# Patient Record
Sex: Female | Born: 1975 | Hispanic: Yes | Marital: Married | Smoking: Never smoker
Health system: Southern US, Community
[De-identification: ages and names within clinical notes are randomized; demographics above are authoritative.]

## PROBLEM LIST (undated history)

## (undated) DIAGNOSIS — D649 Anemia, unspecified: Secondary | ICD-10-CM

## (undated) HISTORY — PX: EYE SURGERY: SHX253

---

## 2015-04-29 ENCOUNTER — Ambulatory Visit (HOSPITAL_COMMUNITY)
Admission: RE | Admit: 2015-04-29 | Discharge: 2015-04-29 | Disposition: A | Discharge status: Home / Self Care | Payer: BLUE CROSS/BLUE SHIELD | Source: Ambulatory Visit | Attending: Obstetrics and Gynecology | Admitting: Obstetrics and Gynecology

## 2015-04-29 ENCOUNTER — Other Ambulatory Visit (HOSPITAL_COMMUNITY): Payer: Self-pay | Admitting: Obstetrics and Gynecology

## 2015-04-29 ENCOUNTER — Encounter (HOSPITAL_COMMUNITY): Payer: Self-pay

## 2015-04-29 VITALS — BP 102/54 | HR 76 | Wt 158.8 lb

## 2015-04-29 DIAGNOSIS — Z3A13 13 weeks gestation of pregnancy: Secondary | ICD-10-CM | POA: Insufficient documentation

## 2015-04-29 DIAGNOSIS — Z315 Encounter for genetic counseling: Secondary | ICD-10-CM | POA: Insufficient documentation

## 2015-04-29 DIAGNOSIS — O9989 Other specified diseases and conditions complicating pregnancy, childbirth and the puerperium: Secondary | ICD-10-CM

## 2015-04-29 DIAGNOSIS — T8331XA Breakdown (mechanical) of intrauterine contraceptive device, initial encounter: Secondary | ICD-10-CM

## 2015-04-29 DIAGNOSIS — O09521 Supervision of elderly multigravida, first trimester: Secondary | ICD-10-CM

## 2015-04-29 DIAGNOSIS — Z975 Presence of (intrauterine) contraceptive device: Secondary | ICD-10-CM | POA: Insufficient documentation

## 2015-04-29 DIAGNOSIS — O2631 Retained intrauterine contraceptive device in pregnancy, first trimester: Secondary | ICD-10-CM

## 2015-04-29 DIAGNOSIS — Z331 Pregnant state, incidental: Secondary | ICD-10-CM

## 2015-04-29 DIAGNOSIS — O09529 Supervision of elderly multigravida, unspecified trimester: Secondary | ICD-10-CM

## 2015-05-01 ENCOUNTER — Other Ambulatory Visit (HOSPITAL_COMMUNITY): Payer: Self-pay | Admitting: Obstetrics and Gynecology

## 2015-05-01 DIAGNOSIS — O09529 Supervision of elderly multigravida, unspecified trimester: Secondary | ICD-10-CM | POA: Insufficient documentation

## 2015-05-01 NOTE — Progress Notes (Signed)
Genetic Counseling  High-Risk Gestation Note  Appointment Date:  04/29/2015 Referred By: Stacie Newport, MD Date of Birth:  [birth year] Partner:  Stacie Richardson   Pregnancy History: [id number] Estimated Date of Delivery: 10/30/15 Estimated Gestational Age: [redacted]w[redacted]d Attending: Eulis Foster, MD   [name], Stacie Richardson, were seen for genetic counseling because of a maternal age of 39 y.o..   Spanish/English medical interpreter, Stacie Richardson, provided interpretation for today's visit.   In Summary:  Discussed advanced maternal age and associations with fetal aneuploidy  Couple elected for ultrasound only in pregnancy and declined additional screening for fetal aneuploidy (including first screen, Quad screen, and NIPS)  Declined amniocentesis  MFM consult today for retained IUD in pregnancy; See separate MFM consult note.   They were counseled regarding maternal age and the association with risk for chromosome conditions due to nondisjunction with aging of the ova.   We reviewed chromosomes, nondisjunction, and the associated 1 in 44 risk for fetal aneuploidy at [redacted]w[redacted]d gestation related to a maternal age of 39 years old at delivery. They were counseled that the risk for aneuploidy decreases as gestational age increases, accounting for those pregnancies which spontaneously abort.  We specifically discussed Down syndrome (trisomy 86), trisomies 22 and 22, and sex chromosome aneuploidies (47,XXX and 47,XXY) including the common features and prognoses of each.   We reviewed available screening options including First Screen, Quad screen, noninvasive prenatal screening (NIPS)/cell free DNA (cfDNA) testing, and detailed ultrasound.  They were counseled that screening tests are used to modify a patient's a priori risk for aneuploidy, typically based on age. This estimate provides a pregnancy specific risk assessment. We reviewed the benefits and limitations of  each option. Specifically, we discussed the conditions for which each test screens, the detection rates, and false positive rates of each. They were also counseled regarding diagnostic testing via amniocentesis. We reviewed the approximate 1 in 300-500 risk for complications for amniocentesis, including spontaneous pregnancy loss. After consideration of all the options, they elected to proceed with ultrasound only in pregnancy and declined maternal serum screening (first screen and Quad screen), NIPS, and amniocentesis for testing for fetal aneuploidy. Detailed ultrasound is scheduled for 05/29/15.  They understand that screening tests cannot rule out all birth defects or genetic syndromes. The patient was advised of this limitation and states she still does not want additional testing at this time.   Ultrasound was performed today. Complete ultrasound results reported separately.   Stacie Richardson was provided with written information regarding sickle cell anemia (SCA) including the carrier frequency and incidence in the Hispanic population, the availability of carrier testing and prenatal diagnosis if indicated.  In addition, we discussed that hemoglobinopathies are routinely screened for as part of the [place] newborn screening panel.  Hemoglobin electrophoresis is available to the patient if not previously performed and if desired.   Both family histories were reviewed and found to be contributory for congenital differences in fingers and toes for the couple's oldest child, a daughter. 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 She is currently 39 years old and is healthy. No additional relatives were reported with differences in their digits. We discussed that congenital differences involving digits can be isolated or syndromic. They can be caused by multifactorial, environmental, or  genetic factors. Isolated differences in fingers and toes can be observed  in some families as an autosomal dominant trait. Given the reported family history, recurrence risk for the current pregnancy would be increased. We discussed that targeted ultrasound is available to assess development of digits. However, they understand that ultrasound cannot detect all physical differences prenatally.  Without further information regarding the provided family history, an accurate genetic risk cannot be calculated. Further genetic counseling is warranted if more information is obtained.  Stacie Richardson denied exposure to environmental toxins or chemical agents. She denied the use of alcohol, tobacco or street drugs. She denied significant viral illnesses during the course of her pregnancy. Her medical and surgical histories were contributory for retained intrauterine device in the current pregnancy. Ms. Poppell had maternal fetal medicine consultation today to discuss this history. See separate MFM consult note.    I counseled this couple regarding the above risks and available options.  The approximate face-to-face time with the genetic counselor was 45 minutes.  Stacie Plowman, MS,  Certified Genetic Counselor 05/01/2015

## 2015-05-01 NOTE — Consult Note (Signed)
MFM consult  39 yr old [id number] at 59w5dreferred for consult secondary to pregnancy with retained copper IUD.  Past Ob hx: 3 full term SVD  No other significant history.  Patient reports has had bleeding this pregnancy. Conceived with copper IUD in place. IUD had been present for 7 years. Per primary OB note the strings could not be visualized at the os.  Ultrasound today shows single intrauterine pregnancy. Fetal crown rump length is consistent with dating. There is a large subchorionic hemorrhage seen. The IUD is seen anterior lower uterine segment.  I counseled the patient as follows: 1. Pregnancy with retained IUD: - discussed risks of miscarriage, bleeding, infection, pregnancy loss, and preterm delivery - discussed these risks are as high as 50% - discussed recommendations are for removal of the IUD if feasible as it significantly lowers the above risks; although these risks still remain elevated over baseline - however if IUD cannot easily be removed as in this case the risks of attempting a technically difficult and involved removal must be weighed against risks of a retained IUD - discussed given the location of the IUD there would be significant risks in attempting removal - I offered to refer patient to Gynecology for them to evaluate if this IUD could be removed - the patient and her husband declined and after considering the above wish to just keep the IUD in place - I recommend bleeding and preterm labor precautions - recommend serial fetal growth  2. Advanced maternal age: - patient met with the genetic counselor; see separate report - after counseling patient declined aneuploidy testing/screening  3. Recommend fetal anatomic survey at 18-20 weeks  I spent a total of 45 minutes with the patient of which >50% was in face to face consultation.  Please call with questions.  [place] MD

## 2015-05-29 ENCOUNTER — Encounter (HOSPITAL_COMMUNITY): Payer: Self-pay

## 2015-05-29 ENCOUNTER — Ambulatory Visit (HOSPITAL_COMMUNITY)
Admission: RE | Admit: 2015-05-29 | Discharge: 2015-05-29 | Disposition: A | Discharge status: Home / Self Care | Payer: BLUE CROSS/BLUE SHIELD | Source: Ambulatory Visit | Attending: Obstetrics and Gynecology | Admitting: Obstetrics and Gynecology

## 2015-05-29 ENCOUNTER — Other Ambulatory Visit (HOSPITAL_COMMUNITY): Payer: Self-pay | Admitting: Obstetrics and Gynecology

## 2015-05-29 DIAGNOSIS — O09521 Supervision of elderly multigravida, first trimester: Secondary | ICD-10-CM | POA: Diagnosis not present

## 2015-05-29 DIAGNOSIS — T8331XA Breakdown (mechanical) of intrauterine contraceptive device, initial encounter: Secondary | ICD-10-CM

## 2015-05-29 DIAGNOSIS — Z331 Pregnant state, incidental: Secondary | ICD-10-CM

## 2015-05-29 DIAGNOSIS — O9989 Other specified diseases and conditions complicating pregnancy, childbirth and the puerperium: Secondary | ICD-10-CM

## 2015-05-29 HISTORY — DX: Anemia, unspecified: D64.9

## 2015-06-26 ENCOUNTER — Ambulatory Visit (HOSPITAL_COMMUNITY): Admission: RE | Admit: 2015-06-26 | Payer: BLUE CROSS/BLUE SHIELD | Source: Ambulatory Visit

## 2016-03-03 ENCOUNTER — Encounter (HOSPITAL_COMMUNITY): Payer: Self-pay | Admitting: *Deleted

## 2016-11-11 IMAGING — US US MFM OB COMPLETE +14 WKS
1 series · 13 of 28 positions shown · non-contrast
Comparison: none

OBSTETRICS REPORT
(Signed Final 05/10/2015 [DATE])

HARENDRA KUMAR
Service(s) Provided
US MFM OB COMP LESS THAN 14 WEEKS                     76801.4
Indications
13 weeks gestation of pregnancy
IUD with pregnancy
Advanced maternal age multigravida (38), first
trimester
Fetal Evaluation
Num Of Fetuses:    1
Preg. Location:    Intrauterine
Fetal Heart Rate:  148                          bpm
Cardiac Activity:  Observed
Presentation:      Variable
Placenta:          Anterior
Comment:    Moderate subchorionic hemorrhage in fundus measuring
3.0 x 1.7 x 5.0 cm
Amniotic Fluid
AFI FV:      Subjectively within normal limits
Biometry
CRL:     85.4  mm     G. Age:  14w 0d                  EDD:   10/28/15
Gestational Age
LMP:           13w 5d        Date:  01/23/15                 EDD:   10/30/15
Best:          13w 5d     Det. By:  LMP  (01/23/15)          EDD:   10/30/15
Anatomy
Cranium:          Appears normal         Cord Vessels:     Appears normal (3
vessel cord)
Choroid Plexus:   Appears normal         Bladder:          Appears normal
Stomach:          Appears normal, left   Lower             Visualized
sided                  Extremities:
Abdominal Wall:   Appears nml (cord      Upper             Visualized
insert, abd wall)      Extremities:
Cervix Uterus Adnexa
Cervix:       Normal appearance by transabdominal scan.
Uterus:       IUD in lower uterine segment.
Left Ovary:    Within normal limits.
Right Ovary:   Complex mass measuring 2.5 x 2.6 x 2.6 cm.
Impression
INDICATION: 38 yr old RGMGCCG at 55w6d for fetal ultrasound.
Conceived with IUD in place.

[Series 1: us mfm ob complete +14 wks · 0.21mm/px · 58 acquisitions, 13 frames shown]
[im 3/58]
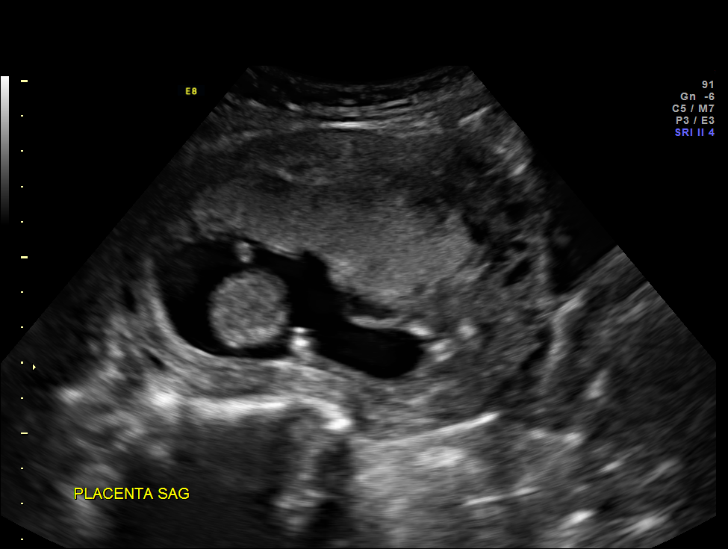
[im 7/58]
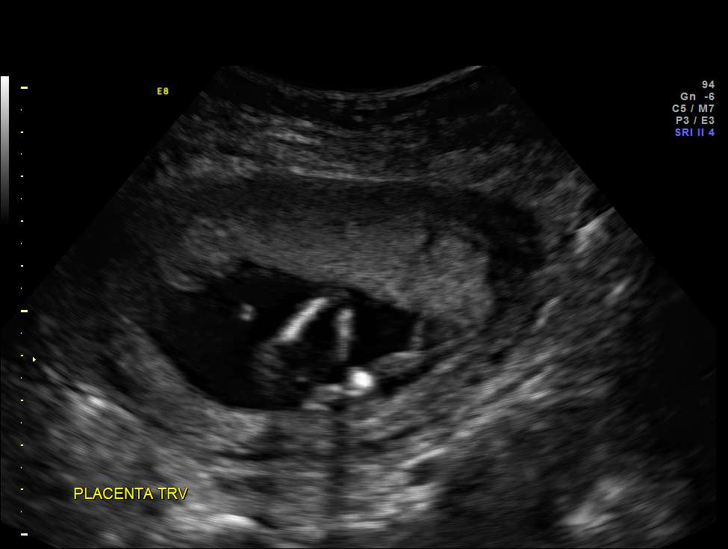
[im 11/58]
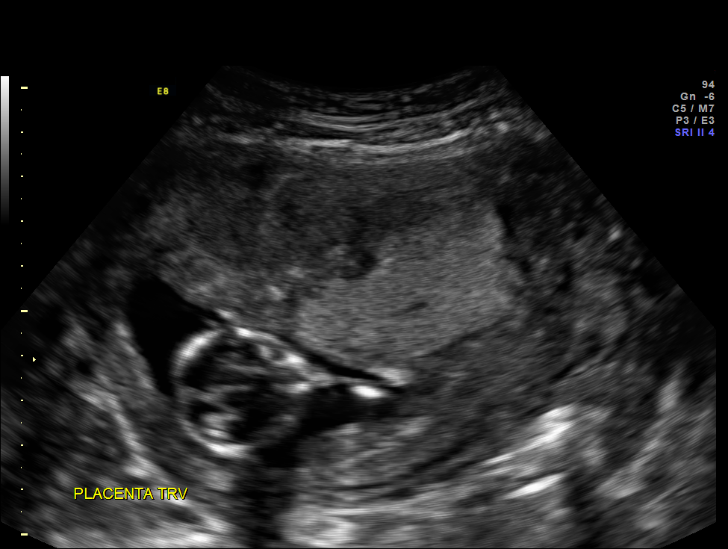
[im 15/58]
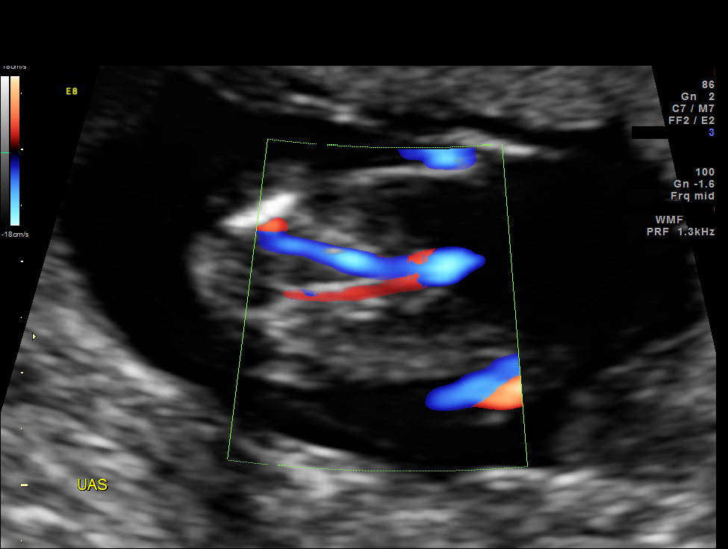
[im 20/58]
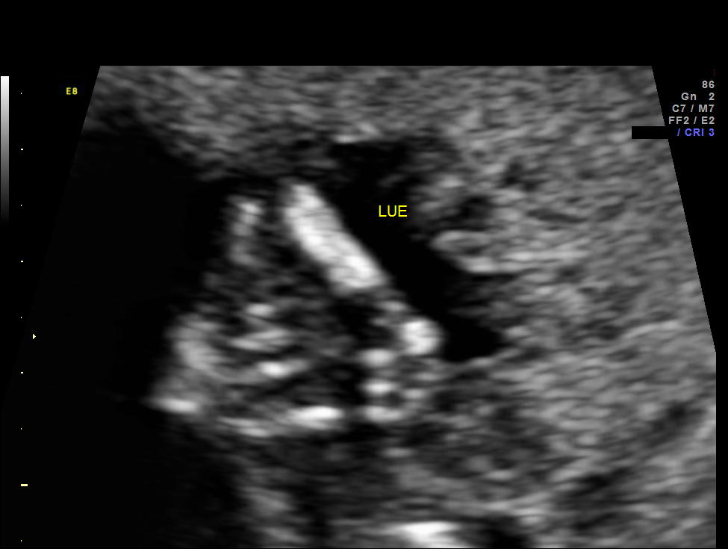
[im 24/58]
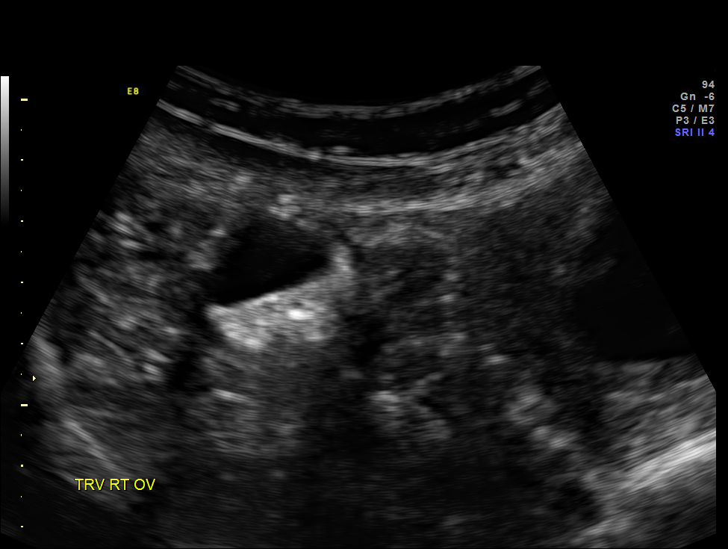
[im 30/58]
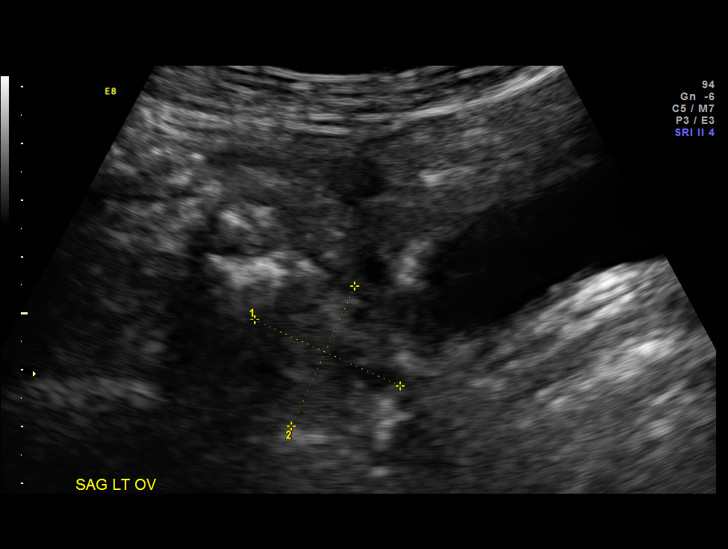
[im 34/58]
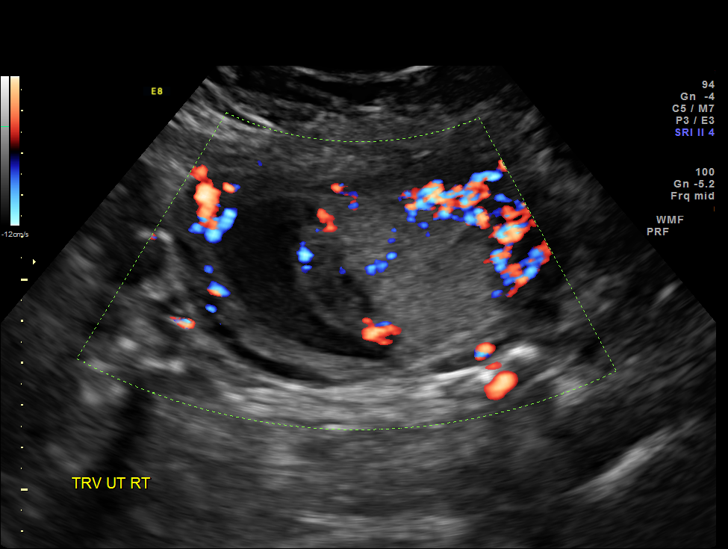
[im 39/58]
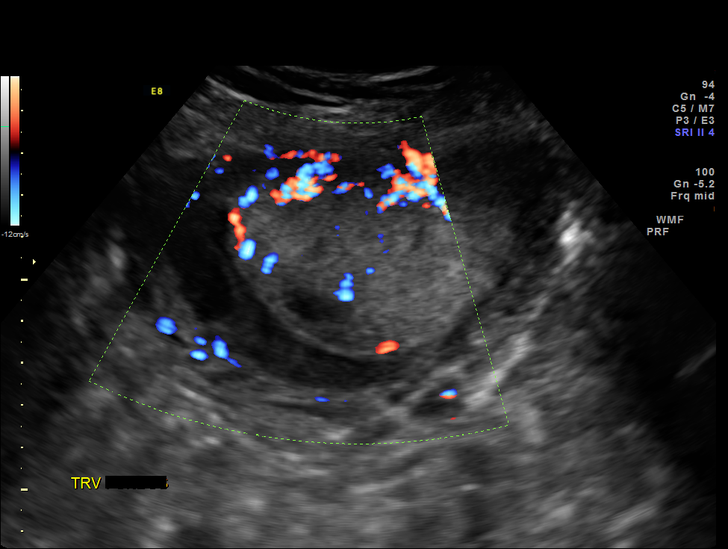
[im 43/58]
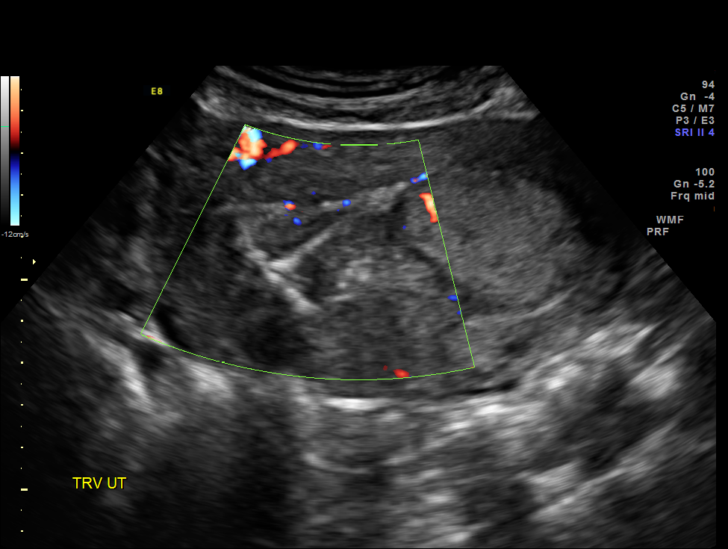
[im 47/58]
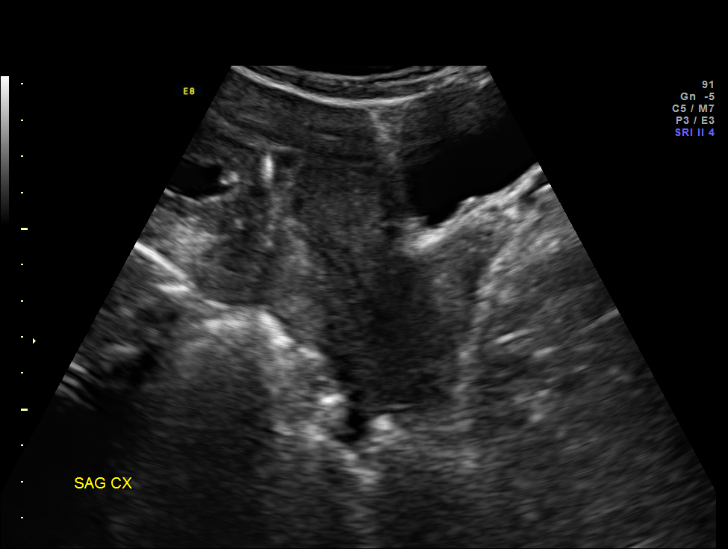
[im 51/58]
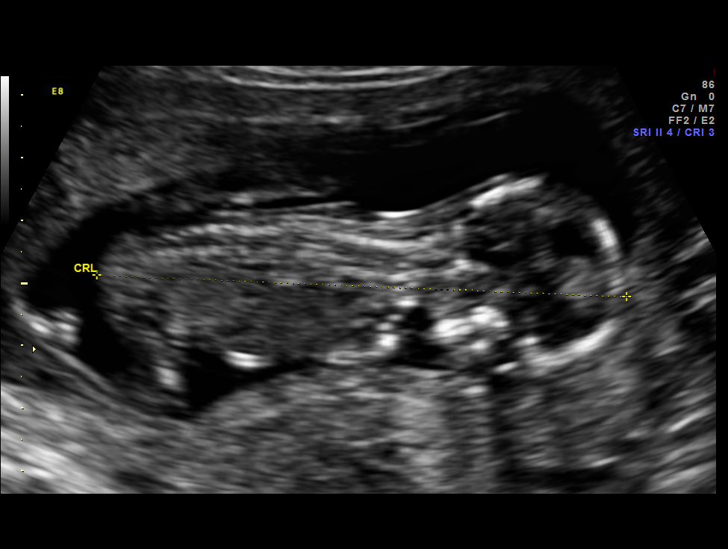
[im 55/58]
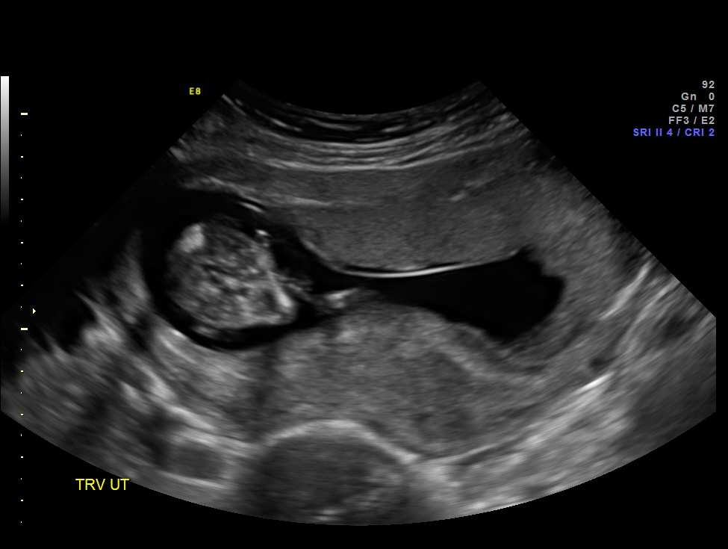

[13 of 28 positions shown; findings below may reference images not displayed]

FINDINGS: 1. Single intrauterine pregnancy.
2. Fetal crown rump length is consistent with dating.
3. [place] uterus; there is a right ovarian cyst measuring
2.6cm.
4. The IUD is seen in the lower uterine segment.
5. The fetal anatomic survey is limited by early gestational
age.
Recommendations

1. Appropriate dating.
2. Pregnancy with IUD in utero:
- see consult letter
3. Patient has opted for expectant management
4. Recommend fetal anatomic survey at 18-20 weeks
5. Recommend serial fetal growth
